# Patient Record
Sex: Female | Born: 1985 | Hispanic: Yes | Marital: Single | Smoking: Current every day smoker
Health system: Southern US, Community
[De-identification: ages and names within clinical notes are randomized; demographics above are authoritative.]

---
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
## 2008-07-03 ENCOUNTER — Emergency Department: Payer: Self-pay | Admitting: Emergency Medicine

## 2011-11-04 ENCOUNTER — Emergency Department: Payer: Self-pay | Admitting: Emergency Medicine

## 2011-11-04 LAB — COMPREHENSIVE METABOLIC PANEL
Albumin: 4.5 g/dL (ref 3.4–5.0)
Alkaline Phosphatase: 82 U/L (ref 50–136)
BUN: 9 mg/dL (ref 7–18)
Bilirubin,Total: 0.3 mg/dL (ref 0.2–1.0)
Calcium, Total: 9.2 mg/dL (ref 8.5–10.1)
EGFR (African American): >60
Glucose: 115 mg/dL — ABNORMAL HIGH (ref 65–99)
Osmolality: 281 (ref 275–301)
SGOT(AST): 20 U/L (ref 15–37)
SGPT (ALT): 35 U/L (ref 12–78)
Sodium: 141 mmol/L (ref 136–145)
Total Protein: 8.9 g/dL — ABNORMAL HIGH (ref 6.4–8.2)

## 2011-11-04 LAB — CBC
HGB: 15.2 g/dL (ref 12.0–16.0)
MCH: 31.7 pg (ref 26.0–34.0)
Platelet: 461 10*3/uL — ABNORMAL HIGH (ref 150–440)
RBC: 4.8 10*6/uL (ref 3.80–5.20)

## 2011-11-04 LAB — HCG, QUANTITATIVE, PREGNANCY: Beta Hcg, Quant.: <1 m[IU]/mL — ABNORMAL LOW

## 2014-05-25 ENCOUNTER — Emergency Department: Admit: 2014-05-25 | Discharge status: Against Medical Advice | Payer: Self-pay | Admitting: Emergency Medicine

## 2017-11-10 ENCOUNTER — Encounter: Payer: Self-pay | Admitting: Emergency Medicine

## 2017-11-10 ENCOUNTER — Other Ambulatory Visit: Payer: Self-pay

## 2017-11-10 ENCOUNTER — Emergency Department
Admission: EM | Admit: 2017-11-10 | Discharge: 2017-11-10 | Disposition: A | Discharge status: Home / Self Care | Payer: Medicaid Other | Attending: Emergency Medicine | Admitting: Emergency Medicine

## 2017-11-10 DIAGNOSIS — K0889 Other specified disorders of teeth and supporting structures: Secondary | ICD-10-CM | POA: Insufficient documentation

## 2017-11-10 NOTE — ED Triage Notes (Signed)
Left upper jaw dental pain x 1 month, worse x 2 days.

## 2017-12-18 ENCOUNTER — Emergency Department: Payer: Medicaid Other

## 2017-12-18 ENCOUNTER — Emergency Department
Admission: EM | Admit: 2017-12-18 | Discharge: 2017-12-18 | Disposition: A | Discharge status: Home / Self Care | Payer: Medicaid Other | Attending: Emergency Medicine | Admitting: Emergency Medicine

## 2017-12-18 ENCOUNTER — Other Ambulatory Visit: Payer: Self-pay

## 2017-12-18 DIAGNOSIS — Z5321 Procedure and treatment not carried out due to patient leaving prior to being seen by health care provider: Secondary | ICD-10-CM | POA: Insufficient documentation

## 2017-12-18 DIAGNOSIS — R079 Chest pain, unspecified: Secondary | ICD-10-CM | POA: Diagnosis not present

## 2017-12-18 LAB — BASIC METABOLIC PANEL
Anion gap: 7 (ref 5–15)
BUN: 10 mg/dL (ref 6–20)
CHLORIDE: 108 mmol/L (ref 98–111)
CO2: 26 mmol/L (ref 22–32)
Calcium: 9.3 mg/dL (ref 8.9–10.3)
Creatinine, Ser: 0.68 mg/dL (ref 0.44–1.00)
GFR calc Af Amer: >60 mL/min (ref >60)
GLUCOSE: 107 mg/dL — AB (ref 70–99)
POTASSIUM: 3.9 mmol/L (ref 3.5–5.1)
Sodium: 141 mmol/L (ref 135–145)

## 2017-12-18 LAB — CBC
HEMATOCRIT: 42.7 % (ref 36.0–46.0)
HEMOGLOBIN: 13.9 g/dL (ref 12.0–15.0)
MCH: 30.4 pg (ref 26.0–34.0)
MCHC: 32.6 g/dL (ref 30.0–36.0)
MCV: 93.4 fL (ref 80.0–100.0)
Platelets: 395 10*3/uL (ref 150–400)
RBC: 4.57 MIL/uL (ref 3.87–5.11)
RDW: 12.7 % (ref 11.5–15.5)
WBC: 9.6 10*3/uL (ref 4.0–10.5)
nRBC: 0 % (ref 0.0–0.2)

## 2017-12-18 LAB — TROPONIN I: Troponin I: <0.03 ng/mL (ref <0.03)

## 2017-12-18 LAB — POCT PREGNANCY, URINE: Preg Test, Ur: NEGATIVE

## 2017-12-18 NOTE — ED Notes (Signed)
Pt states she has to go to work and would like to leave AMA. AMA form signed and witnessed by this RN. EDP Goodman aware.

## 2017-12-18 NOTE — ED Notes (Signed)
Urine collected and sent to lab.

## 2017-12-18 NOTE — ED Triage Notes (Addendum)
Patient arrives c/o right sided pain that states, "feels like my lung." Patient states symptoms started this morning upon waking. Pain travels to mid chest, worse when taking a deep breath. Patient states pain worsened after eating and drinking today. +SOB, +dizziness, +nausea, -vomiting. Denies any significant medical history or surgeries.

## 2017-12-22 NOTE — ED Provider Notes (Signed)
Patient left before my evaluation. I never evaluated or talked with patient.   Phineas Semen, MD 12/22/17 [phone]

## 2018-12-19 ENCOUNTER — Other Ambulatory Visit: Payer: Self-pay

## 2018-12-19 ENCOUNTER — Emergency Department
Admission: EM | Admit: 2018-12-19 | Discharge: 2018-12-19 | Disposition: A | Discharge status: Home / Self Care | Payer: Medicaid Other | Attending: Emergency Medicine | Admitting: Emergency Medicine

## 2018-12-19 ENCOUNTER — Emergency Department: Payer: Medicaid Other

## 2018-12-19 DIAGNOSIS — R0789 Other chest pain: Secondary | ICD-10-CM | POA: Diagnosis not present

## 2018-12-19 DIAGNOSIS — R079 Chest pain, unspecified: Secondary | ICD-10-CM | POA: Diagnosis present

## 2018-12-19 DIAGNOSIS — F17210 Nicotine dependence, cigarettes, uncomplicated: Secondary | ICD-10-CM | POA: Insufficient documentation

## 2018-12-19 LAB — CBC WITH DIFFERENTIAL/PLATELET
Abs Immature Granulocytes: 0.03 10*3/uL (ref 0.00–0.07)
Basophils Absolute: 0.1 10*3/uL (ref 0.0–0.1)
Basophils Relative: 1 %
Eosinophils Absolute: 0.5 10*3/uL (ref 0.0–0.5)
Eosinophils Relative: 6 %
HCT: 44.8 % (ref 36.0–46.0)
Hemoglobin: 15.1 g/dL — ABNORMAL HIGH (ref 12.0–15.0)
Immature Granulocytes: 0 %
Lymphocytes Relative: 34 %
Lymphs Abs: 2.8 10*3/uL (ref 0.7–4.0)
MCH: 30 pg (ref 26.0–34.0)
MCHC: 33.7 g/dL (ref 30.0–36.0)
MCV: 89.1 fL (ref 80.0–100.0)
Monocytes Absolute: 0.6 10*3/uL (ref 0.1–1.0)
Monocytes Relative: 7 %
Neutro Abs: 4.3 10*3/uL (ref 1.7–7.7)
Neutrophils Relative %: 52 %
Platelets: 359 10*3/uL (ref 150–400)
RBC: 5.03 MIL/uL (ref 3.87–5.11)
RDW: 12.2 % (ref 11.5–15.5)
WBC: 8.2 10*3/uL (ref 4.0–10.5)
nRBC: 0 % (ref 0.0–0.2)

## 2018-12-19 LAB — COMPREHENSIVE METABOLIC PANEL
ALT: 19 U/L (ref 0–44)
AST: 16 U/L (ref 15–41)
Albumin: 4.5 g/dL (ref 3.5–5.0)
Alkaline Phosphatase: 75 U/L (ref 38–126)
Anion gap: 10 (ref 5–15)
BUN: 13 mg/dL (ref 6–20)
CO2: 22 mmol/L (ref 22–32)
Calcium: 9.4 mg/dL (ref 8.9–10.3)
Chloride: 107 mmol/L (ref 98–111)
Creatinine, Ser: 0.73 mg/dL (ref 0.44–1.00)
GFR calc Af Amer: >60 mL/min (ref >60)
GFR calc non Af Amer: >60 mL/min (ref >60)
Glucose, Bld: 151 mg/dL — ABNORMAL HIGH (ref 70–99)
Potassium: 3.9 mmol/L (ref 3.5–5.1)
Sodium: 139 mmol/L (ref 135–145)
Total Bilirubin: 0.3 mg/dL (ref 0.3–1.2)
Total Protein: 7.6 g/dL (ref 6.5–8.1)

## 2018-12-19 LAB — MAGNESIUM: Magnesium: 2.1 mg/dL (ref 1.7–2.4)

## 2018-12-19 LAB — FIBRIN DERIVATIVES D-DIMER (ARMC ONLY): Fibrin derivatives D-dimer (ARMC): 359.67 ng/mL (FEU) (ref 0.00–499.00)

## 2018-12-19 LAB — TROPONIN I (HIGH SENSITIVITY): Troponin I (High Sensitivity): <2 ng/L (ref <18)

## 2018-12-19 MED ORDER — OXYCODONE-ACETAMINOPHEN 5-325 MG PO TABS
2.0000 | ORAL_TABLET | Freq: Once | ORAL | Status: AC
  Administered 2018-12-19: 2 via ORAL
  Filled 2018-12-19: qty 2

## 2018-12-19 MED ORDER — IBUPROFEN 600 MG PO TABS: ORAL_TABLET | ORAL | 0 refills | Status: AC

## 2018-12-19 MED ORDER — KETOROLAC TROMETHAMINE 30 MG/ML IJ SOLN
15.0000 mg | Freq: Once | INTRAMUSCULAR | Status: AC
  Administered 2018-12-19: 15 mg via INTRAVENOUS
  Filled 2018-12-19: qty 1

## 2018-12-19 NOTE — Discharge Instructions (Signed)

## 2018-12-19 NOTE — ED Provider Notes (Signed)
[facility] Emergency Department Provider Note  ____________________________________________   First MD Initiated Contact with Patient 12/19/18 [phone]     (approximate)  I have reviewed the triage vital signs and the nursing notes.   HISTORY  Chief Complaint Chest Pain    HPI Selena Dunn is a 33 y.o. female who presents by EMS for evaluation of acute onset chest pain.  She reports that it occurred about 2:00 in the morning while she was asleep and it has been severe.  It is worse when she tries to move around and when she presses on her chest, particular on the right side.  It is also present in the right side of her back.  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   She took some ibuprofen when the pain started but it did not get any better.  She also suffers from anxiety and did admit that she feels quite anxious.  She has had some numbness in her chest as well.  She has not been in contact with anyone known to have COVID-19.  She has no headache, sore throat, shortness of breath, nausea, vomiting, nor abdominal pain.  Nothing in particular makes his symptoms better or worse and she reports that they are severe.  She has no history of blood clots in her legs nor her lungs.  She has Nexplanon for birth control.        History reviewed. No pertinent past medical history.  There are no active problems to display for this patient.   History reviewed. No pertinent surgical history.  Prior to Admission medications   Medication Sig Start Date End Date Taking? Authorizing Provider  ibuprofen (ADVIL) 600 MG tablet Take 1 tablet by mouth three times daily with meals 12/19/18   Hinda Kehr, MD    Allergies Patient has no allergy information on record.  History reviewed. No pertinent family history.  Social History Social History   Tobacco Use  . Smoking status: Current Every  Day Smoker    Types: Cigarettes  Substance Use Topics  . Alcohol use: Not Currently  . Drug use: Yes    Types: Marijuana    Review of Systems Constitutional: No fever/chills Eyes: No visual changes. ENT: No sore throat. Cardiovascular: +chest pain. Respiratory: Denies shortness of breath. Gastrointestinal: No abdominal pain.  No nausea, no vomiting.  No diarrhea.  No constipation. Genitourinary: Negative for dysuria. Musculoskeletal: Negative for neck pain.  Negative for back pain. Integumentary: Negative for rash. Neurological: Negative for headaches, focal weakness or numbness. Psych: Anxious but without emergent warning signs of deeper psychiatric issues.  ____________________________________________   PHYSICAL EXAM:  VITAL SIGNS: ED Triage Vitals  Enc Vitals Group     BP 12/19/18 0510 (!) 147/93     Pulse Rate 12/19/18 0507 73     Resp 12/19/18 0507 (!) 30     Temp 12/19/18 0507 97.6 F (36.4 C)     Temp Source 12/19/18 0507 Oral     SpO2 12/19/18 0507 100 %     Weight 12/19/18 0511 65.8 kg (145 lb)     Height 12/19/18 0511 1.575 m (5\' 2" )     Head Circumference --      Peak Flow --      Pain Score 12/19/18 0510 9     Pain Loc --      Pain Edu? --  Excl. in GC? --     Constitutional: Alert and oriented.  Appears anxious and uncomfortable. Eyes: Conjunctivae are normal.  Head: Atraumatic. Nose: No congestion/rhinnorhea. Mouth/Throat: Patient is wearing a mask. Neck: No stridor.  No meningeal signs.   Cardiovascular: Normal rate, regular rhythm. Good peripheral circulation. Grossly normal heart sounds. Respiratory: Normal respiratory effort.  No retractions. Gastrointestinal: Soft and nontender. No distention.  Musculoskeletal: Highly reproducible right-sided chest wall tenderness to palpation all throughout the right anterior chest as well as some tenderness to palpation around the right scapula.  No lower extremity tenderness nor edema. No gross  deformities of extremities. Neurologic:  Normal speech and language. No gross focal neurologic deficits are appreciated.  Skin:  Skin is warm, dry and intact. Psychiatric: Mood and affect are anxious but generally appropriate under the circumstances.  ____________________________________________   LABS (all labs ordered are listed, but only abnormal results are displayed)  Labs Reviewed  CBC WITH DIFFERENTIAL/PLATELET - Abnormal; Notable for the following components:      Result Value   Hemoglobin 15.1 (*)    All other components within normal limits  COMPREHENSIVE METABOLIC PANEL - Abnormal; Notable for the following components:   Glucose, Bld 151 (*)    All other components within normal limits  MAGNESIUM  FIBRIN DERIVATIVES D-DIMER ([facility] ONLY)  POC URINE PREG, ED  TROPONIN I (HIGH SENSITIVITY)   ____________________________________________  EKG  ED ECG REPORT I, Loleta Rose, the attending physician, personally viewed and interpreted this ECG.  Date: 12/19/2018 EKG Time: 5:08 AM Rate: 67 Rhythm: normal sinus rhythm QRS Axis: normal Intervals: normal ST/T Wave abnormalities: Early repolarization with evidence of minimal ST segment elevation throughout, I believe this is due to normal early repolarization pattern rather than pericarditis or acute ischemia Narrative Interpretation: no evidence of acute ischemia  ____________________________________________  RADIOLOGY I, Loleta Rose, personally viewed and evaluated these images (plain radiographs) as part of my medical decision making, as well as reviewing the written report by the radiologist.  ED MD interpretation: No evidence of acute abnormality on chest x-ray  Official radiology report(s): Dg Chest 2 View  Result Date: 12/19/2018 CLINICAL DATA:  Chest pain since this morning. EXAM: CHEST - 2 VIEW COMPARISON:  12/18/2017 FINDINGS: The cardiac silhouette, mediastinal and hilar contours are normal. The lungs are  clear. No pleural effusions. The bony thorax is intact. IMPRESSION: No acute cardiopulmonary findings. Electronically Signed   By: Rudie Meyer M.D.   On: 12/19/2018 05:32    ____________________________________________   PROCEDURES   Procedure(s) performed (including Critical Care):  Procedures   ____________________________________________   INITIAL IMPRESSION / MDM / ASSESSMENT AND PLAN / ED COURSE  As part of my medical decision making, I reviewed the following data within the electronic MEDICAL RECORD NUMBER Nursing notes reviewed and incorporated, Labs reviewed , EKG interpreted , Old chart reviewed, Radiograph reviewed  and Notes from prior ED visits   Differential diagnosis includes, but is not limited to, anxiety, musculoskeletal pain, costochondritis, pericarditis/myocarditis, PE.  The patient has highly reproducible chest wall tenderness to palpation that is highly suggestive of musculoskeletal strain or possibly costochondritis.  EKG is generally reassuring; although she does have some ST segment elevation I think this is representative of early repolarization rather than pericarditis.  Her pain does not seem to change with position, rather with moving around in general.  However because of the acute onset of the symptoms while she was asleep I think it is reasonable to check a D-dimer; even  though theoretically she is PERC negative, I am not sure how the Nexplanon plays into that, and I would rather further risk stratify her by making sure her D-dimer is normal.  Lab work is pending.  I anticipate a normal troponin.  I do not feel that it is necessary to repeat a troponin given her very low HEAR score and very low clinical suspicion for ACS.  The only reason I am getting it now is to make sure there is not elevated which would be more suggestive of pericarditis/myocarditis than of ACS.  She has had no recent viral symptoms and is asymptomatic except as described above.  No indication  for Covid testing.  I will treat her with Toradol 15 mg IV and 2 Percocet.      Clinical Course as of Dec 18 800  Wed Dec 19, 2018  0600 Troponin I (High Sensitivity): <2 [CF]  0600 Magnesium: 2.1 [CF]  0600 Comprehensive metabolic panel(!) [CF]  [phone] D-dimer is within normal limits providing more reassurance that the patient is not suffering from an emergent medical condition.  I will discharge her with outpatient management and follow-up recommendations.  I gave my usual and customary return precautions.  Fibrin derivatives D-dimer [facility]): 359.67 [CF]    Clinical Course User Index [CF] Loleta RoseForbach, Lyndee Herbst, MD     ____________________________________________  FINAL CLINICAL IMPRESSION(S) / ED DIAGNOSES  Final diagnoses:  Chest wall pain     MEDICATIONS GIVEN DURING THIS VISIT:  Medications  ketorolac (TORADOL) 30 MG/ML injection 15 mg (15 mg Intravenous Given 12/19/18 0639)  oxyCODONE-acetaminophen (PERCOCET/ROXICET) 5-325 MG per tablet 2 tablet (2 tablets Oral Given 12/19/18 [id number])     ED Discharge Orders         Ordered    ibuprofen (ADVIL) 600 MG tablet     12/19/18 [id number]          *Please note:  Selena Dunn was evaluated in Emergency Department on 12/19/2018 for the symptoms described in the history of present illness. She was evaluated in the context of the global COVID-19 pandemic, which necessitated consideration that the patient might be at risk for infection with the SARS-CoV-2 virus that causes COVID-19. Institutional protocols and algorithms that pertain to the evaluation of patients at risk for COVID-19 are in a state of rapid change based on information released by regulatory bodies including the CDC and federal and state organizations. These policies and algorithms were followed during the patient's care in the ED.  Some ED evaluations and interventions may be delayed as a result of limited staffing during the pandemic.*  Note:  This document was  prepared using Dragon voice recognition software and may include unintentional dictation errors.   Loleta RoseForbach, Journii Nierman, MD 12/19/18 [phone]

## 2018-12-19 NOTE — ED Triage Notes (Signed)
Pt arrived via [facility] from home with CP, numbness, and anxiety. Pt works as a Manufacturing engineer heavy boxes. BP elevated with ems. Pain reproduceable with touch. Pt took ibuprofen when pain started.

## 2019-05-27 ENCOUNTER — Ambulatory Visit: Payer: Medicaid Other | Attending: Internal Medicine

## 2019-05-27 DIAGNOSIS — Z23 Encounter for immunization: Secondary | ICD-10-CM

## 2019-05-27 NOTE — Progress Notes (Signed)
   Covid-19 Vaccination Clinic  Name:  Selena Dunn    MRN: [id number] DOB: 13-Jun-1985  05/27/2019  [name] was observed post Covid-19 immunization for 15 minutes without incident. She was provided with Vaccine Information Sheet and instruction to access the V-Safe system.   [name] was instructed to call 911 with any severe reactions post vaccine: Marland Kitchen Difficulty breathing  . Swelling of face and throat  . A fast heartbeat  . A bad rash all over body  . Dizziness and weakness   Immunizations Administered    Name Date Dose VIS Date Route   Pfizer COVID-19 Vaccine 05/27/2019 11:01 AM 0.3 mL 02/01/2019 Intramuscular   Manufacturer: [employer], [employer]   Lot: [id number]   NDC: [address]-5

## 2019-06-17 ENCOUNTER — Ambulatory Visit: Payer: Medicaid Other

## 2020-04-17 IMAGING — CR DG CHEST 2V
1 series · 2 of 2 positions shown · non-contrast
Comparison: None.

CLINICAL DATA: Acute chest pain today.

EXAM:
CHEST - 2 VIEW

[Series 1: dg chest 2 view · 0.14mm/px · 2 of 2 slices shown]
[im 1/2]
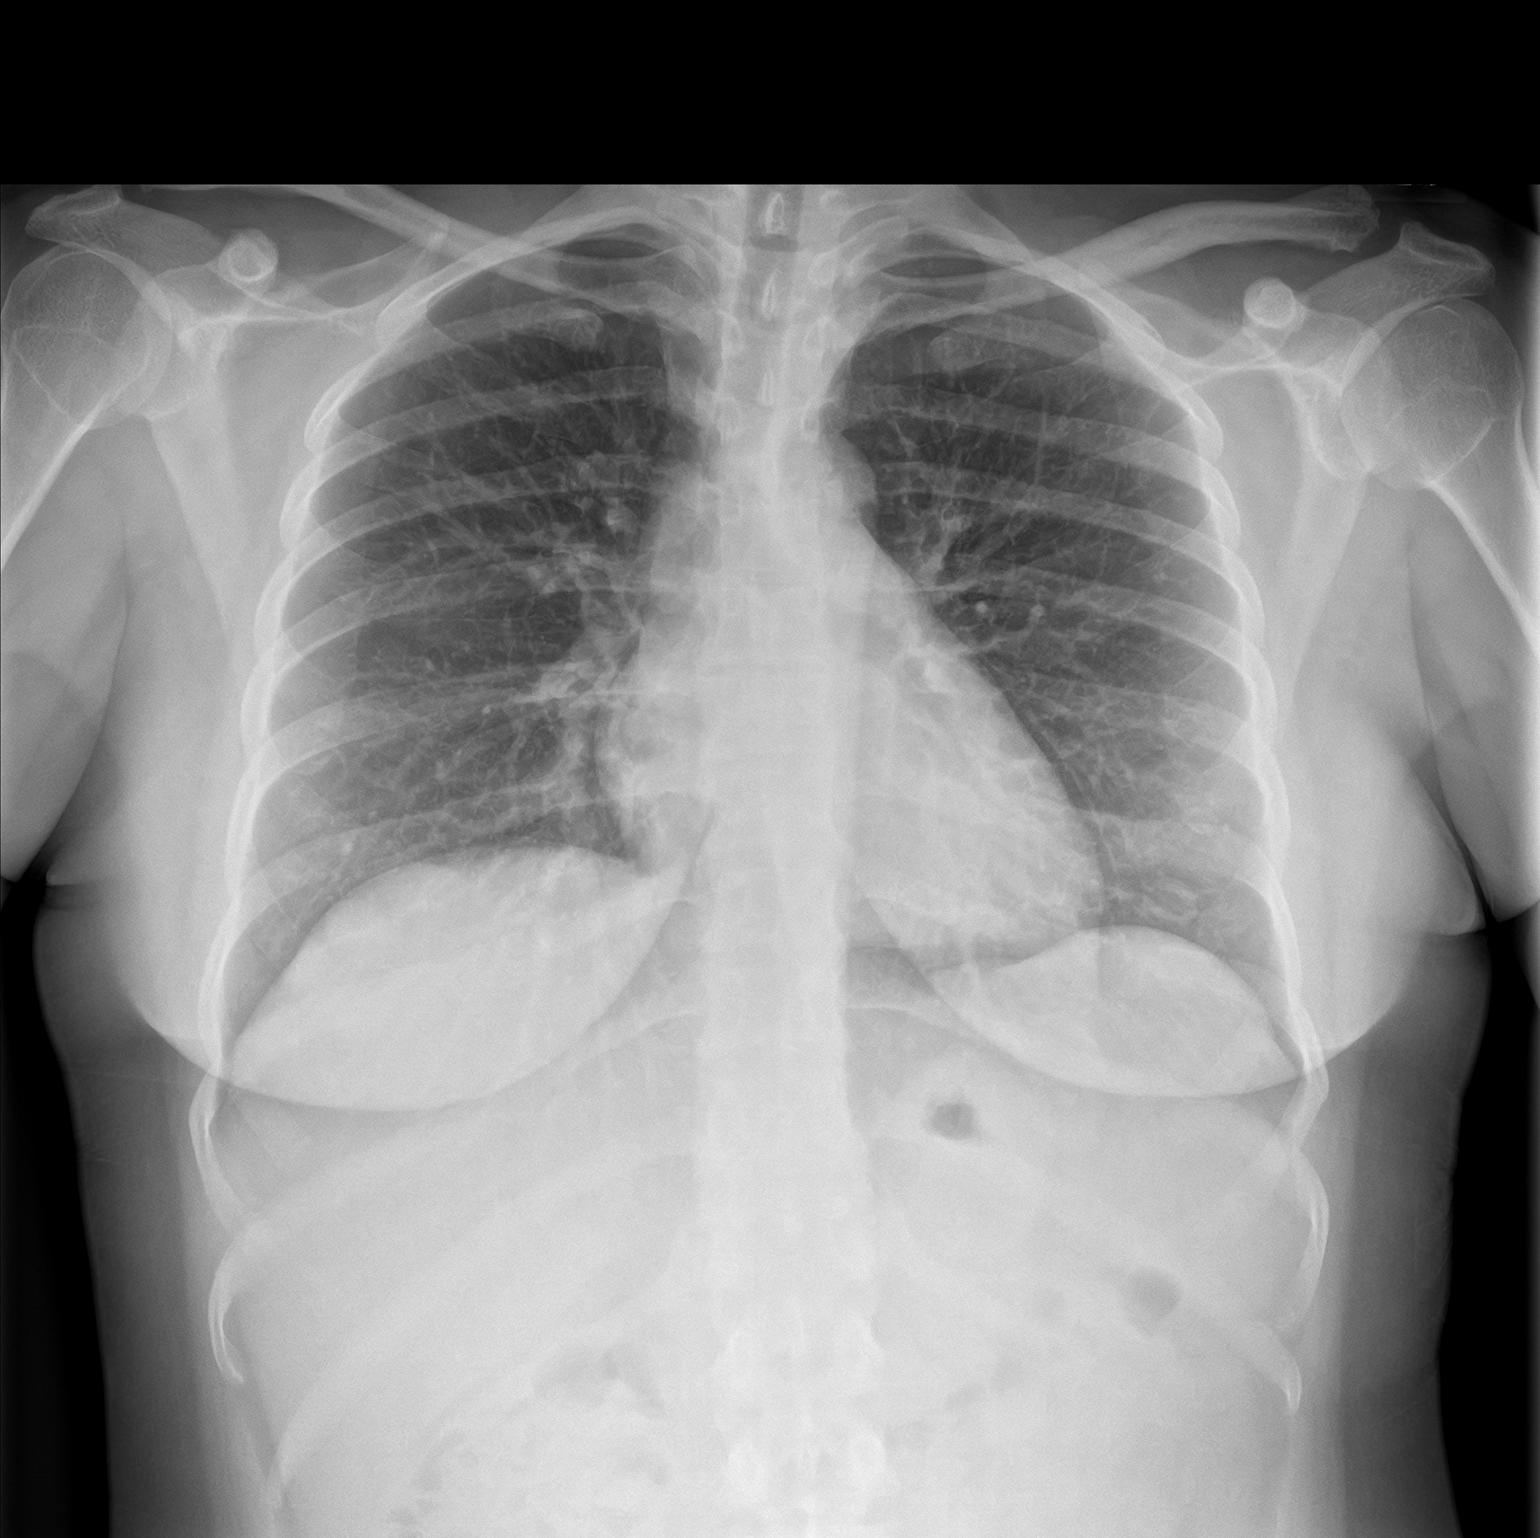
[im 2/2]
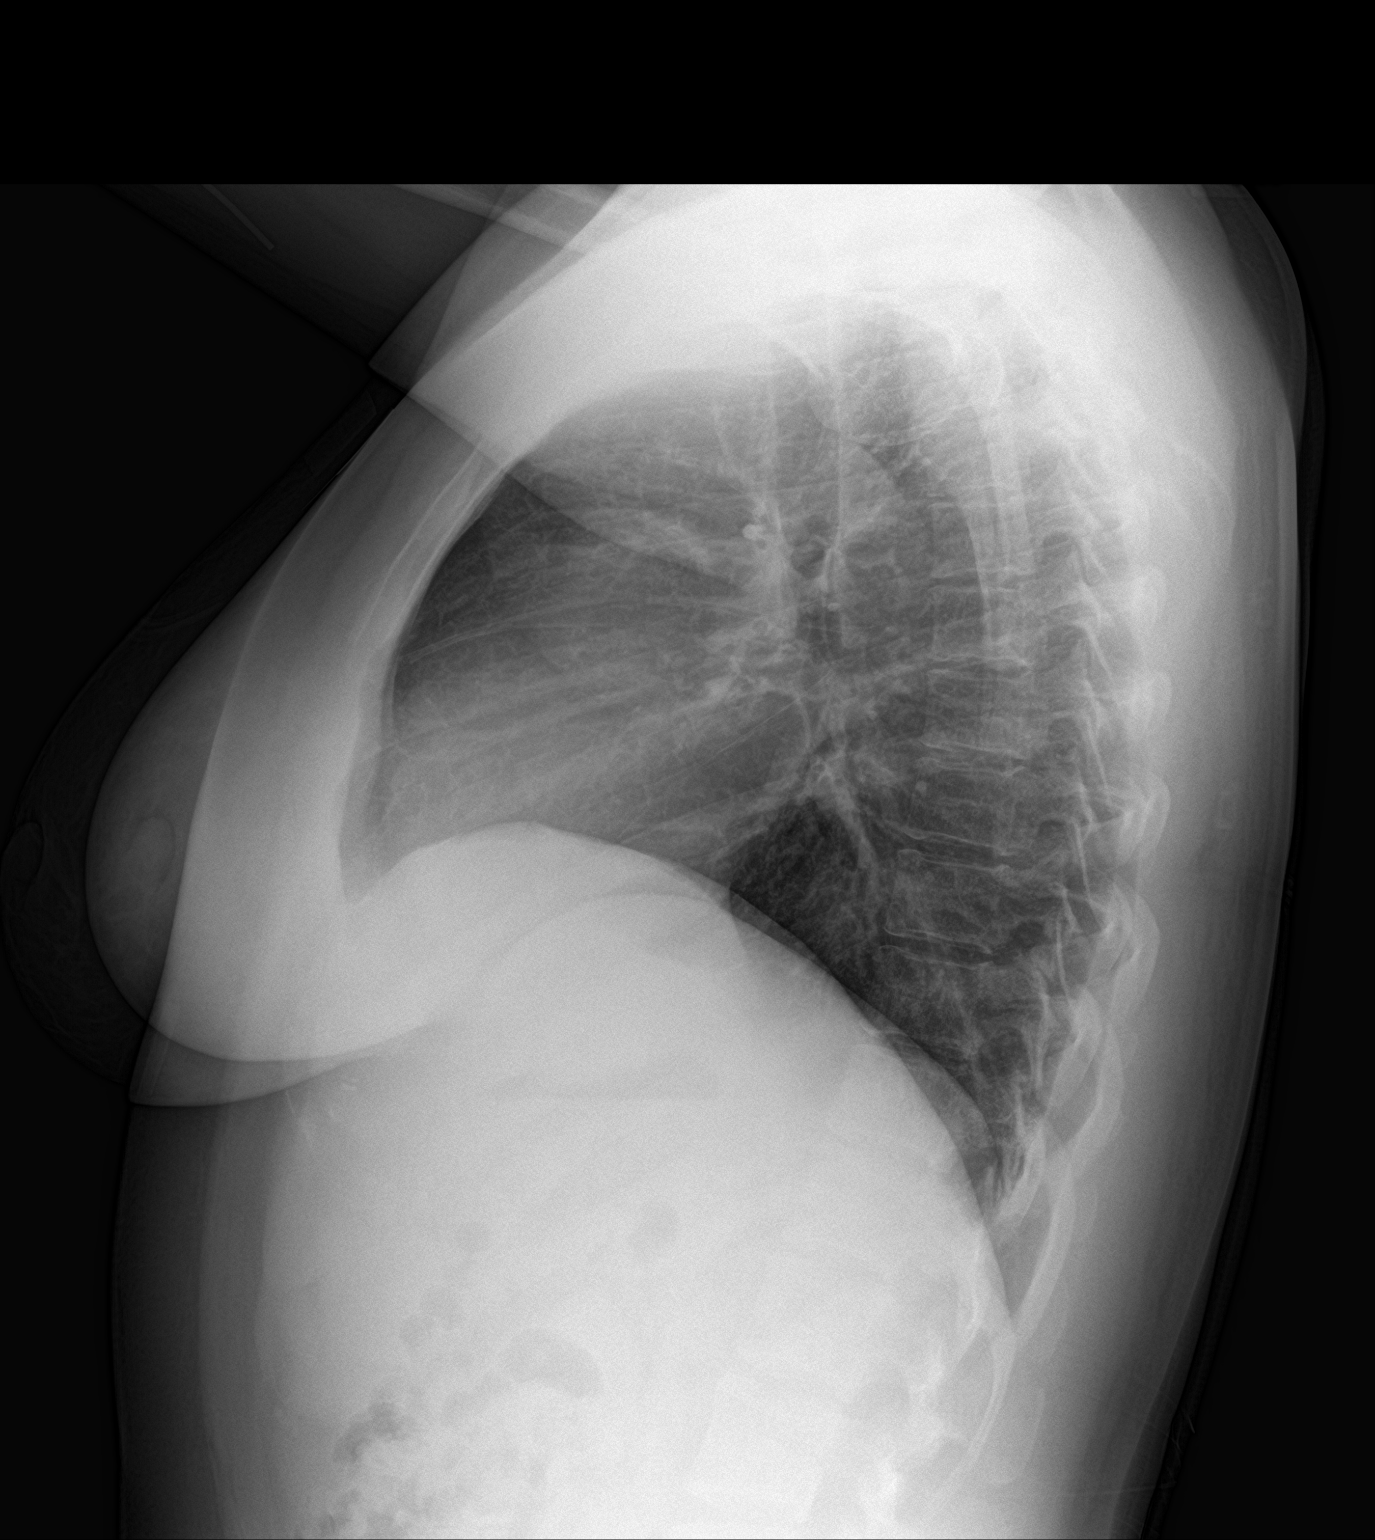

[2 of 2 positions shown; findings below may reference images not displayed]

FINDINGS: The cardiomediastinal silhouette is unremarkable.

There is no evidence of focal airspace disease, pulmonary edema,
suspicious pulmonary nodule/mass, pleural effusion, or pneumothorax.

No acute bony abnormalities are identified.
IMPRESSION: No active cardiopulmonary disease.

## 2021-04-18 IMAGING — CR DG CHEST 2V
2 series · 2 of 2 positions shown · non-contrast
Comparison: 12/18/2017

CLINICAL DATA: Chest pain since this morning.

EXAM:
CHEST - 2 VIEW

[chest pa]
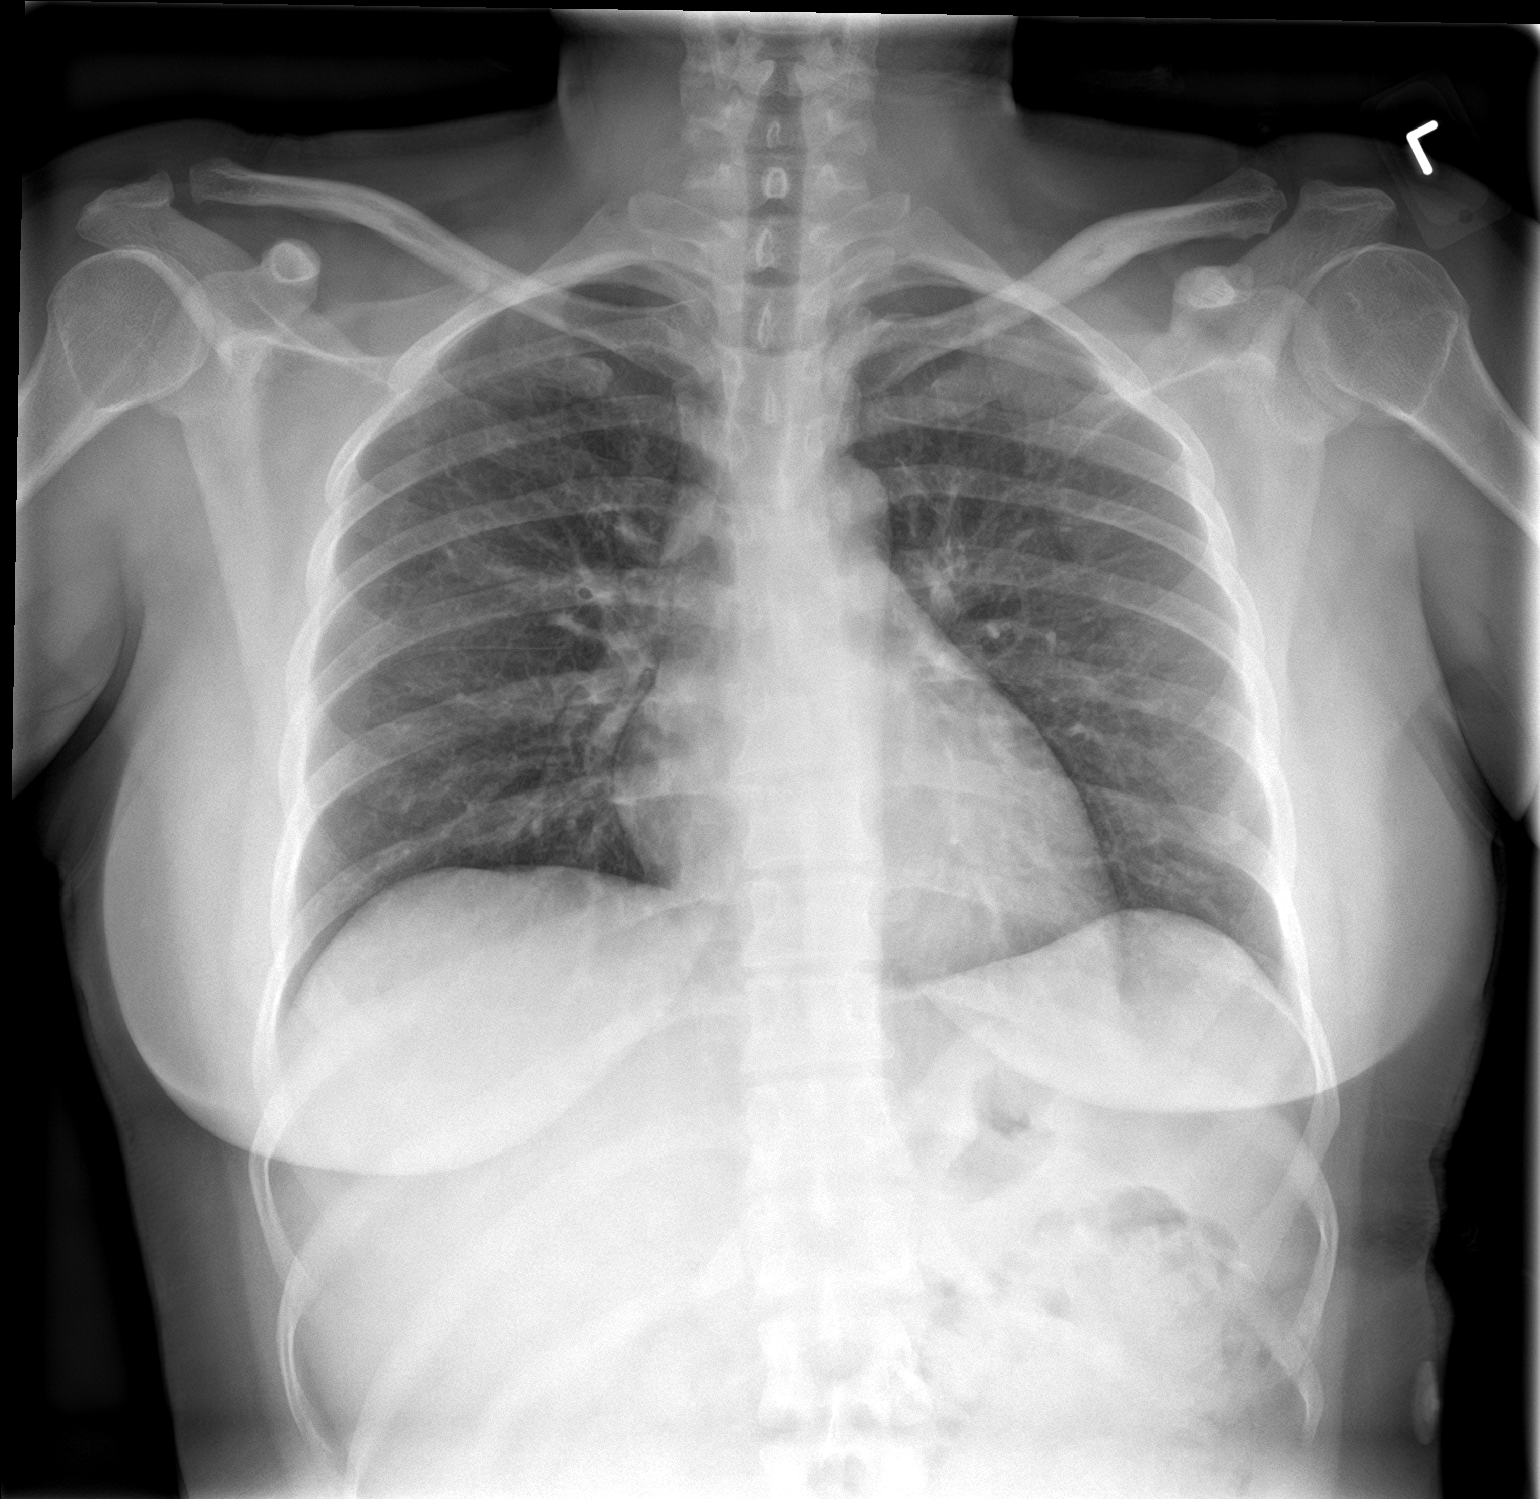

[chest lat]
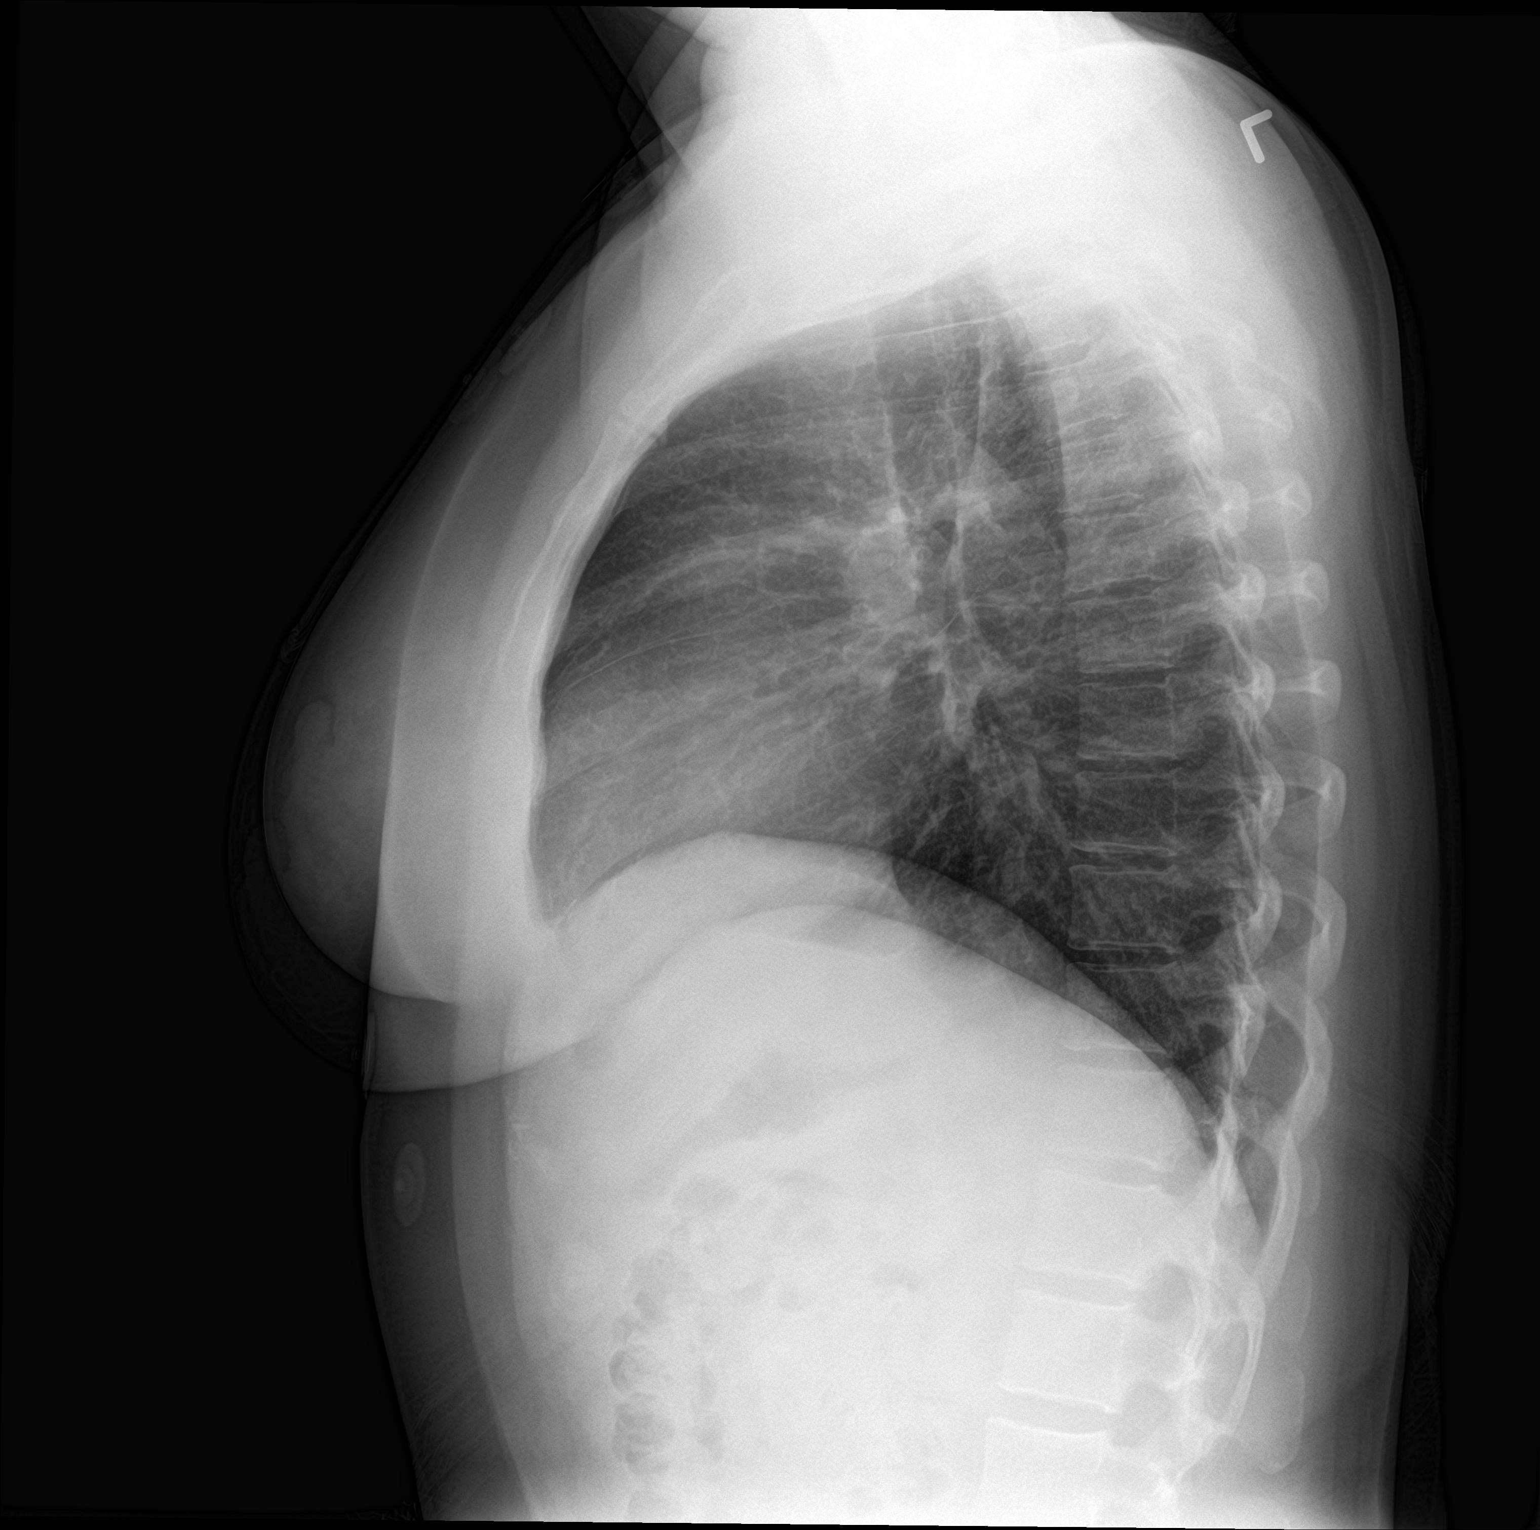

[2 of 2 positions shown; findings below may reference images not displayed]

FINDINGS: The cardiac silhouette, mediastinal and hilar contours are normal.
The lungs are clear. No pleural effusions. The bony thorax is
intact.
IMPRESSION: No acute cardiopulmonary findings.

## 2022-12-05 ENCOUNTER — Other Ambulatory Visit: Payer: Self-pay

## 2022-12-05 ENCOUNTER — Emergency Department
Admission: EM | Admit: 2022-12-05 | Discharge: 2022-12-06 | Discharge status: Against Medical Advice | Payer: Medicaid Other | Attending: Emergency Medicine | Admitting: Emergency Medicine

## 2022-12-05 DIAGNOSIS — S01511A Laceration without foreign body of lip, initial encounter: Secondary | ICD-10-CM | POA: Diagnosis present

## 2022-12-05 DIAGNOSIS — Z5321 Procedure and treatment not carried out due to patient leaving prior to being seen by health care provider: Secondary | ICD-10-CM | POA: Insufficient documentation
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
## 2022-12-06 NOTE — ED Notes (Signed)
Pt sticks head out door of room stating "how much longer?  Selena Dunn been here for over 3 hours.  I have to go to work".  Pt advised shes been here under 2 hours and it would be best for her to wait for provider and she said "No, I have to go". Pt walked out door to lobby.
